# Patient Record
Sex: Male | Born: 1970 | Race: Black or African American | Hispanic: No | Marital: Single | Smoking: Never smoker
Health system: Southern US, Community
[De-identification: ages and names within clinical notes are randomized; demographics above are authoritative.]

## PROBLEM LIST (undated history)

## (undated) DIAGNOSIS — I10 Essential (primary) hypertension: Secondary | ICD-10-CM

---

## 2001-03-06 ENCOUNTER — Emergency Department (HOSPITAL_COMMUNITY): Admission: EM | Admit: 2001-03-06 | Discharge: 2001-03-06 | Payer: Self-pay | Admitting: Emergency Medicine

## 2002-04-19 ENCOUNTER — Emergency Department (HOSPITAL_COMMUNITY): Admission: EM | Admit: 2002-04-19 | Discharge: 2002-04-19 | Payer: Self-pay | Admitting: *Deleted

## 2010-06-14 ENCOUNTER — Emergency Department (HOSPITAL_COMMUNITY): Admission: EM | Admit: 2010-06-14 | Discharge: 2010-06-14 | Payer: Self-pay | Admitting: Emergency Medicine

## 2010-06-26 ENCOUNTER — Emergency Department (HOSPITAL_COMMUNITY): Admission: EM | Admit: 2010-06-26 | Discharge: 2010-06-26 | Payer: Self-pay | Admitting: Emergency Medicine

## 2010-12-24 ENCOUNTER — Emergency Department (HOSPITAL_COMMUNITY)
Admission: EM | Admit: 2010-12-24 | Discharge: 2010-12-24 | Payer: Self-pay | Source: Home / Self Care | Admitting: Emergency Medicine

## 2021-07-10 ENCOUNTER — Emergency Department (HOSPITAL_COMMUNITY): Payer: PRIVATE HEALTH INSURANCE

## 2021-07-10 ENCOUNTER — Emergency Department (HOSPITAL_COMMUNITY)
Admission: EM | Admit: 2021-07-10 | Discharge: 2021-07-10 | Disposition: A | Discharge status: Home / Self Care | Payer: PRIVATE HEALTH INSURANCE | Attending: Emergency Medicine | Admitting: Emergency Medicine

## 2021-07-10 ENCOUNTER — Encounter (HOSPITAL_COMMUNITY): Payer: Self-pay | Admitting: Emergency Medicine

## 2021-07-10 ENCOUNTER — Other Ambulatory Visit: Payer: Self-pay

## 2021-07-10 DIAGNOSIS — M25512 Pain in left shoulder: Secondary | ICD-10-CM

## 2021-07-10 DIAGNOSIS — M7522 Bicipital tendinitis, left shoulder: Secondary | ICD-10-CM | POA: Insufficient documentation

## 2021-07-10 DIAGNOSIS — M67912 Unspecified disorder of synovium and tendon, left shoulder: Secondary | ICD-10-CM

## 2021-07-10 DIAGNOSIS — I10 Essential (primary) hypertension: Secondary | ICD-10-CM | POA: Insufficient documentation

## 2021-07-10 HISTORY — DX: Essential (primary) hypertension: I10

## 2021-07-10 MED ORDER — KETOROLAC TROMETHAMINE 30 MG/ML IJ SOLN
30.0000 mg | Freq: Once | INTRAMUSCULAR | Status: AC
  Administered 2021-07-10: 30 mg via INTRAMUSCULAR
  Filled 2021-07-10: qty 1

## 2021-07-10 MED ORDER — NAPROXEN 500 MG PO TABS: 500.0000 mg | ORAL_TABLET | Freq: Two times a day (BID) | ORAL | 0 refills | Status: AC

## 2021-07-10 NOTE — ED Triage Notes (Signed)
Pt c/o left shoulder pain for a couple of days.
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
## 2021-07-10 NOTE — Discharge Instructions (Addendum)
You were seen today for shoulder pain.  Your x-ray shows a condition called calcific tendinopathy.  This sometimes will resolve on its own.  However, you need to take anti-inflammatory medications and make sure you are doing range of motion exercises with your shoulder.  You may need physical therapy.  Orthopedic follow-up provided.

## 2022-11-16 IMAGING — DX DG SHOULDER 2+V*L*
3 series · 3 of 3 positions shown · non-contrast
Comparison: 08/30/2020

CLINICAL DATA: Left shoulder pain for 1 week

EXAM:
LEFT SHOULDER - 2+ VIEW

[shoulder grashey]
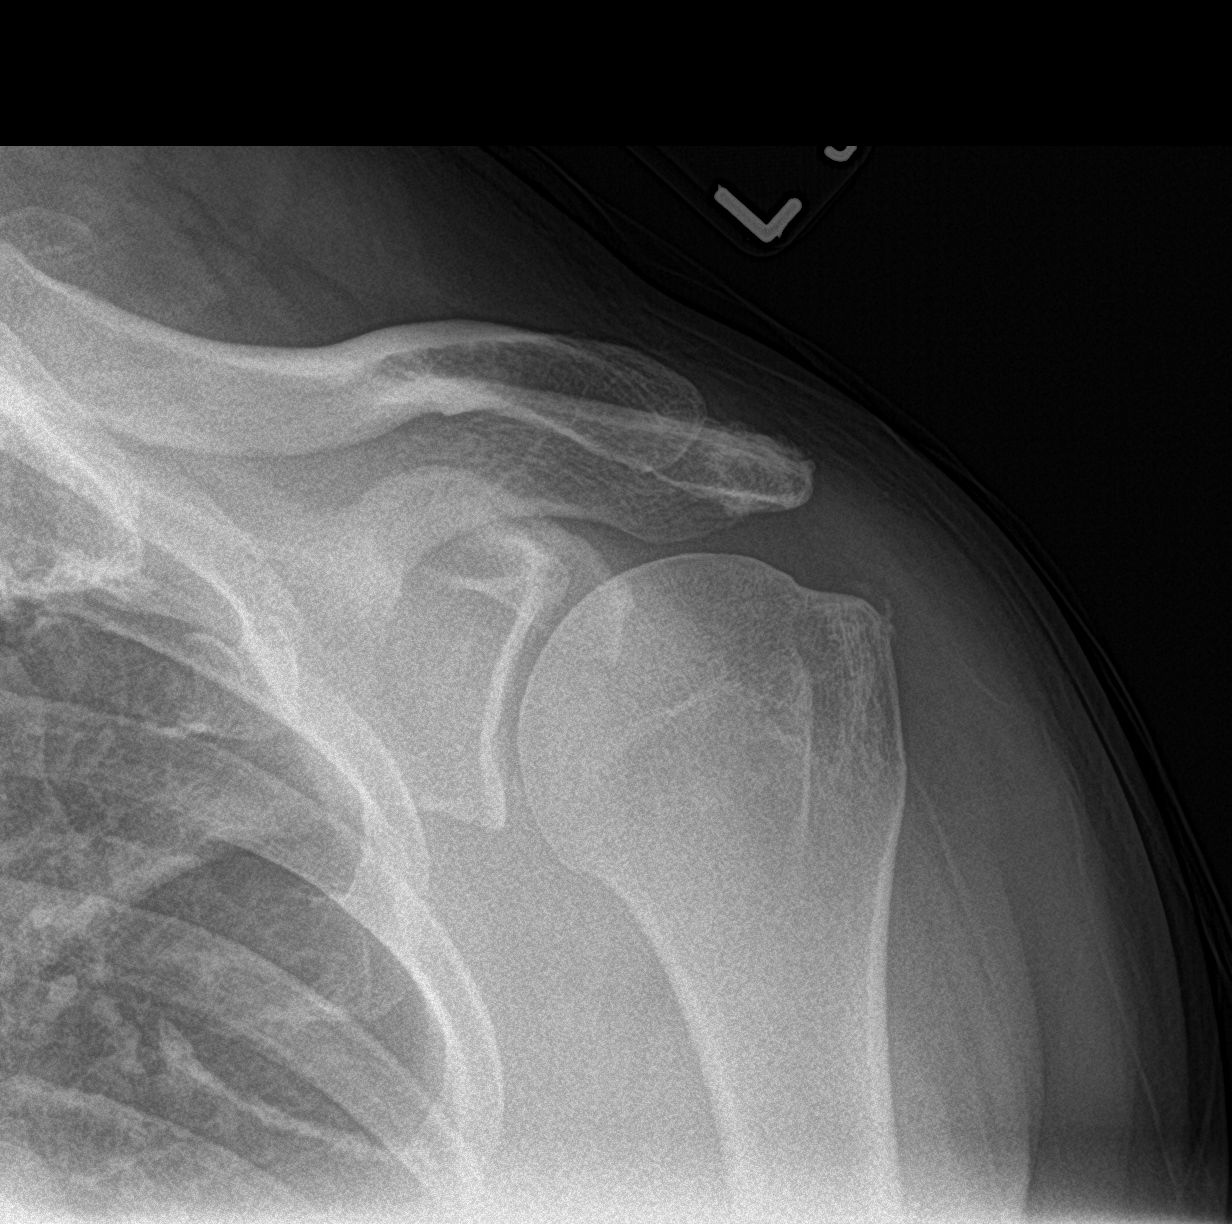

[shoulder y view]
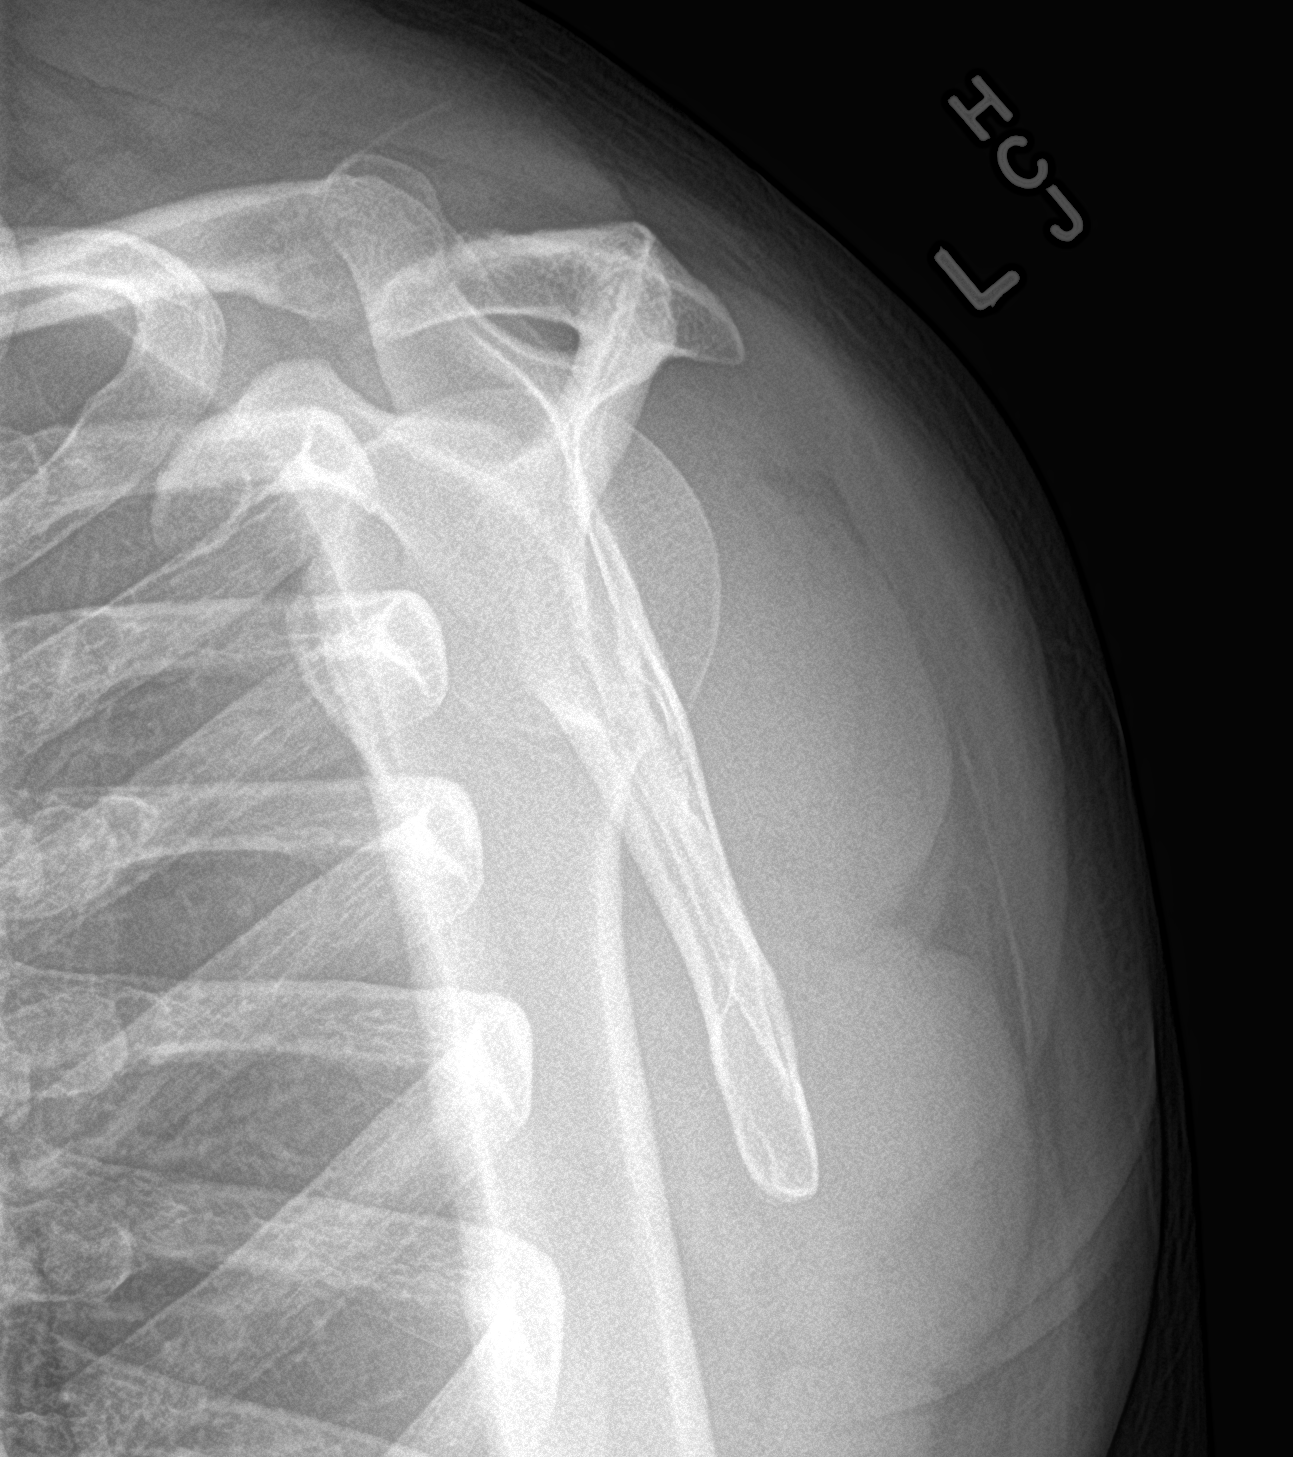

[shoulder axillary]
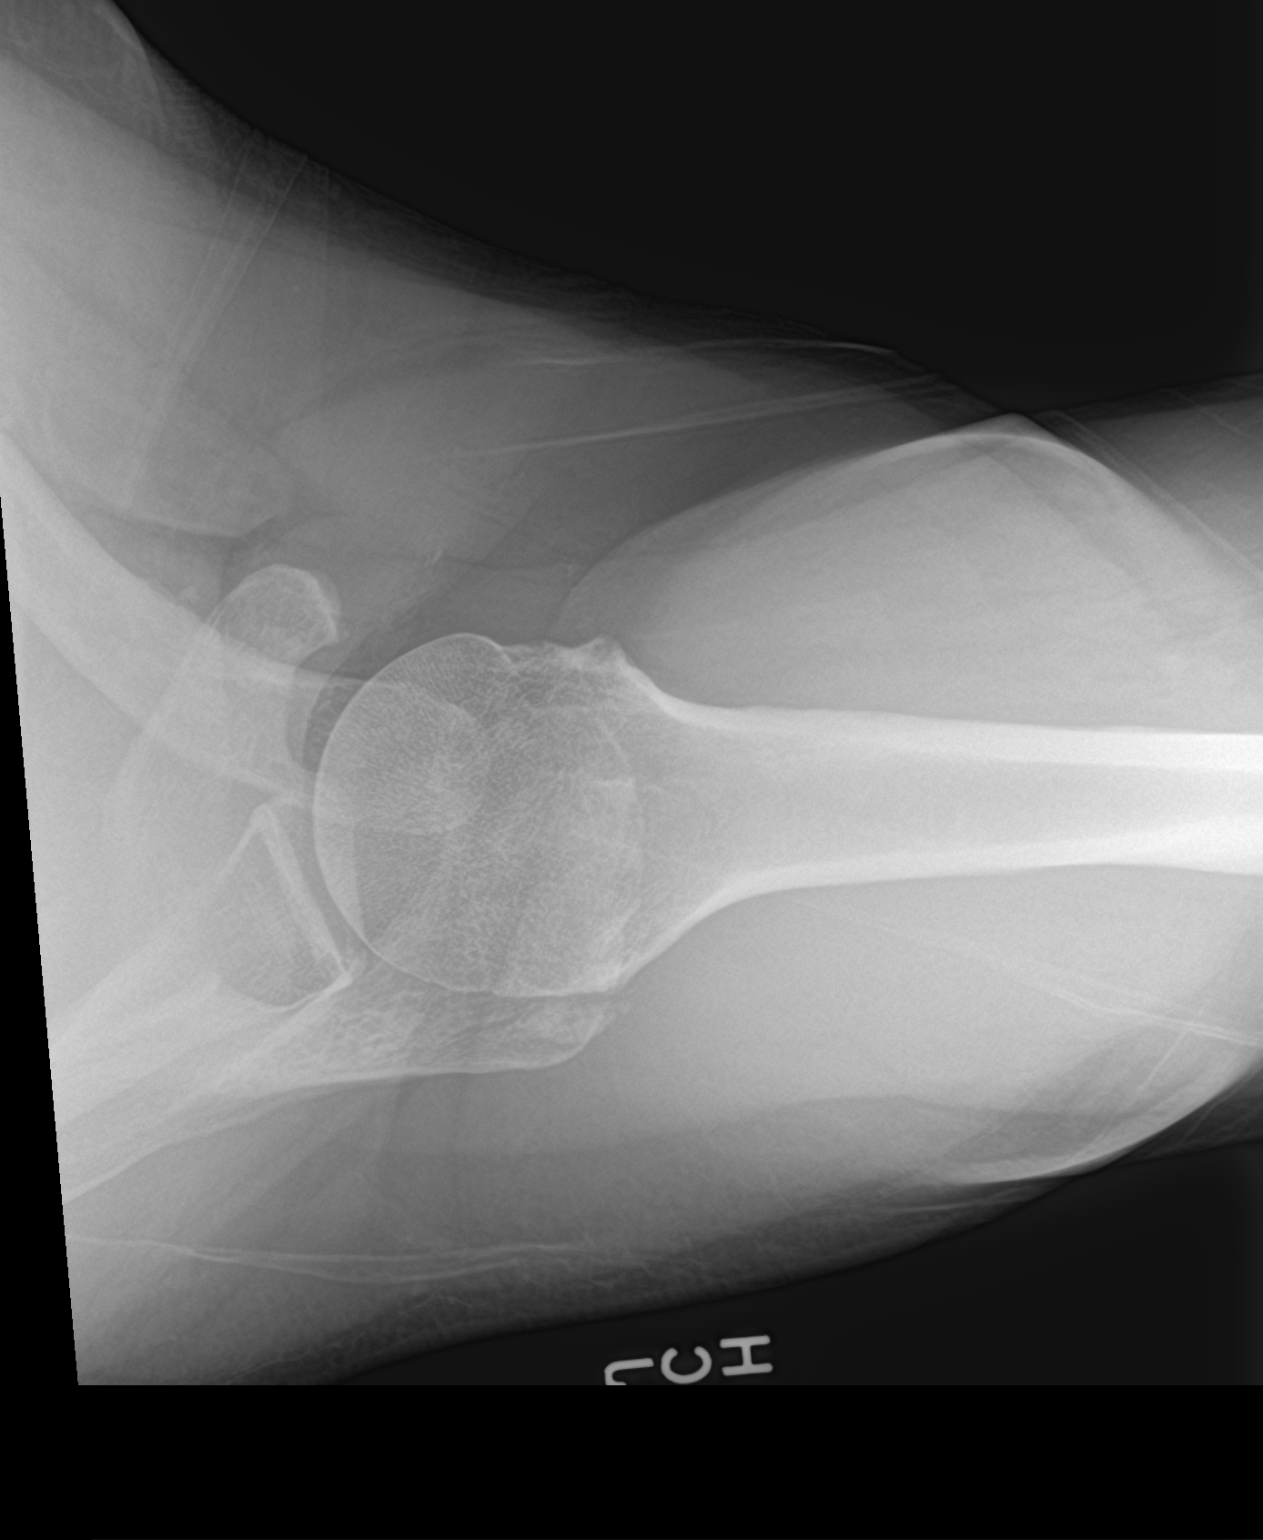

[3 of 3 positions shown; findings below may reference images not displayed]

FINDINGS: There is evidence of calcific tendinopathy primarily of the
infraspinatus and to a lesser extent of the supraspinatus tendons.
Subacromial morphology is type 2 (curved). No fracture or acute bony
findings.
IMPRESSION: 1. Calcific tendinopathy primarily of the infraspinatus tendon and
to a lesser extent of the distal supraspinatus tendon.
